# Patient Record
Sex: Male | Born: 1985 | Race: White | Hispanic: No | Marital: Married | Smoking: Never smoker
Health system: Southern US, Community
[De-identification: ages and names within clinical notes are randomized; demographics above are authoritative.]

## PROBLEM LIST (undated history)

## (undated) HISTORY — PX: MOUTH SURGERY: SHX715

---

## 2017-09-08 DIAGNOSIS — H5213 Myopia, bilateral: Secondary | ICD-10-CM | POA: Diagnosis not present

## 2017-11-04 ENCOUNTER — Encounter: Payer: Self-pay | Admitting: Family Medicine

## 2017-11-04 ENCOUNTER — Ambulatory Visit: Payer: Self-pay | Admitting: Family Medicine

## 2017-11-04 VITALS — BP 122/80 | HR 74 | Temp 98.9°F | Ht 68.0 in | Wt 220.0 lb

## 2017-11-04 DIAGNOSIS — Z0000 Encounter for general adult medical examination without abnormal findings: Secondary | ICD-10-CM

## 2017-11-04 LAB — POCT URINALYSIS DIPSTICK
BILIRUBIN UA: NEGATIVE
Blood, UA: NEGATIVE
GLUCOSE UA: NEGATIVE
KETONES UA: NEGATIVE
Leukocytes, UA: NEGATIVE
Nitrite, UA: NEGATIVE
PH UA: 7 (ref 5.0–8.0)
Protein, UA: NEGATIVE
Spec Grav, UA: 1.01 (ref 1.010–1.025)
Urobilinogen, UA: 0.2 E.U./dL

## 2017-11-04 NOTE — Patient Instructions (Signed)
Encouraged efforts to reduce weight include engaging in physical activity as tolerated with goal of 150 minutes per week. Improve dietary choices and eat a meal regimen consistent with a Mediterranean or DASH diet. Reduce simple carbohydrates. Do not skip meals and eat healthy snacks throughout the day to avoid over-eating at dinner. Set a goal weight loss that is achievable for you.  Follow-up with PCP to obtain routine screening labs within the next 12 months.     Health Maintenance, Male A healthy lifestyle and preventive care is important for your health and wellness. Ask your health care provider about what schedule of regular examinations is right for you. What should I know about weight and diet? Eat a Healthy Diet  Eat plenty of vegetables, fruits, whole grains, low-fat dairy products, and lean protein.  Do not eat a lot of foods high in solid fats, added sugars, or salt.  Maintain a Healthy Weight Regular exercise can help you achieve or maintain a healthy weight. You should:  Do at least 150 minutes of exercise each week. The exercise should increase your heart rate and make you sweat (moderate-intensity exercise).  Do strength-training exercises at least twice a week.  Watch Your Levels of Cholesterol and Blood Lipids  Have your blood tested for lipids and cholesterol every 5 years starting at 33 years of age. If you are at high risk for heart disease, you should start having your blood tested when you are 33 years old. You may need to have your cholesterol levels checked more often if: ? Your lipid or cholesterol levels are high. ? You are older than 33 years of age. ? You are at high risk for heart disease.  What should I know about cancer screening? Many types of cancers can be detected early and may often be prevented. Lung Cancer  You should be screened every year for lung cancer if: ? You are a current smoker who has smoked for at least 30 years. ? You are a  former smoker who has quit within the past 15 years.  Talk to your health care provider about your screening options, when you should start screening, and how often you should be screened.  Colorectal Cancer  Routine colorectal cancer screening usually begins at 33 years of age and should be repeated every 5-10 years until you are 33 years old. You may need to be screened more often if early forms of precancerous polyps or small growths are found. Your health care provider may recommend screening at an earlier age if you have risk factors for colon cancer.  Your health care provider may recommend using home test kits to check for hidden blood in the stool.  A small camera at the end of a tube can be used to examine your colon (sigmoidoscopy or colonoscopy). This checks for the earliest forms of colorectal cancer.  Prostate and Testicular Cancer  Depending on your age and overall health, your health care provider may do certain tests to screen for prostate and testicular cancer.  Talk to your health care provider about any symptoms or concerns you have about testicular or prostate cancer.  Skin Cancer  Check your skin from head to toe regularly.  Tell your health care provider about any new moles or changes in moles, especially if: ? There is a change in a mole's size, shape, or color. ? You have a mole that is larger than a pencil eraser.  Always use sunscreen. Apply sunscreen liberally and repeat throughout  the day.  Protect yourself by wearing long sleeves, pants, a wide-brimmed hat, and sunglasses when outside.  What should I know about heart disease, diabetes, and high blood pressure?  If you are 38-39 years of age, have your blood pressure checked every 3-5 years. If you are 39 years of age or older, have your blood pressure checked every year. You should have your blood pressure measured twice-once when you are at a hospital or clinic, and once when you are not at a hospital or  clinic. Record the average of the two measurements. To check your blood pressure when you are not at a hospital or clinic, you can use: ? An automated blood pressure machine at a pharmacy. ? A home blood pressure monitor.  Talk to your health care provider about your target blood pressure.  If you are between 2-52 years old, ask your health care provider if you should take aspirin to prevent heart disease.  Have regular diabetes screenings by checking your fasting blood sugar level. ? If you are at a normal weight and have a low risk for diabetes, have this test once every three years after the age of 45. ? If you are overweight and have a high risk for diabetes, consider being tested at a younger age or more often.  A one-time screening for abdominal aortic aneurysm (AAA) by ultrasound is recommended for men aged 22-75 years who are current or former smokers. What should I know about preventing infection? Hepatitis B If you have a higher risk for hepatitis B, you should be screened for this virus. Talk with your health care provider to find out if you are at risk for hepatitis B infection. Hepatitis C Blood testing is recommended for:  Everyone born from [birth year] through 1966.  Anyone with known risk factors for hepatitis C.  Sexually Transmitted Diseases (STDs)  You should be screened each year for STDs including gonorrhea and chlamydia if: ? You are sexually active and are younger than 33 years of age. ? You are older than 33 years of age and your health care provider tells you that you are at risk for this type of infection. ? Your sexual activity has changed since you were last screened and you are at an increased risk for chlamydia or gonorrhea. Ask your health care provider if you are at risk.  Talk with your health care provider about whether you are at high risk of being infected with HIV. Your health care provider may recommend a prescription medicine to help prevent HIV  infection.  What else can I do?  Schedule regular health, dental, and eye exams.  Stay current with your vaccines (immunizations).  Do not use any tobacco products, such as cigarettes, chewing tobacco, and e-cigarettes. If you need help quitting, ask your health care provider.  Limit alcohol intake to no more than 2 drinks per day. One drink equals 12 ounces of beer, 5 ounces of wine, or 1 ounces of hard liquor.  Do not use street drugs.  Do not share needles.  Ask your health care provider for help if you need support or information about quitting drugs.  Tell your health care provider if you often feel depressed.  Tell your health care provider if you have ever been abused or do not feel safe at home. This information is not intended to replace advice given to you by your health care provider. Make sure you discuss any questions you have with your health care provider. Document  Released: 10/25/2007 Document Revised: 12/26/2015 Document Reviewed: 01/30/2015 Elsevier Interactive Patient Education  [employer].

## 2017-11-04 NOTE — Progress Notes (Signed)
Patient ID: BIRD SWETZ, male    DOB: [birth year], 32 y.o.   MRN: [id number]  PCP: No primary care provider on file.  Chief Complaint  Patient presents with  . Wellness Exam    Subjective:  HPI ZADRIAN MCCAULEY is a 32 y.o. male presents for wellness visit in order to satisfy employee sponsored health insurance benefits. No recent follow-up with a primary care provider. Denies any health concerns. Reports no routine exercise or eating restriction. He is negative of any past surgical procedures. Denies shortness of breath, chest pain, dizziness, or headaches.  Family history: Denies any family history significant for cardiovascular disease, diabetes, and or lung disease.  Social History   Socioeconomic History  . Marital status: Not on file    Spouse name: Not on file  . Number of children: Not on file  . Years of education: Not on file  . Highest education level: Not on file  Occupational History  . Not on file  Social Needs  . Financial resource strain: Not on file  . Food insecurity:    Worry: Not on file    Inability: Not on file  . Transportation needs:    Medical: Not on file    Non-medical: Not on file  Tobacco Use  . Smoking status: Never Smoker  . Smokeless tobacco: Never Used  Substance and Sexual Activity  . Alcohol use: Not on file  . Drug use: Not on file  . Sexual activity: Not on file  Lifestyle  . Physical activity:    Days per week: Not on file    Minutes per session: Not on file  . Stress: Not on file  Relationships  . Social connections:    Talks on phone: Not on file    Gets together: Not on file    Attends religious service: Not on file    Active member of club or organization: Not on file    Attends meetings of clubs or organizations: Not on file    Relationship status: Not on file  . Intimate partner violence:    Fear of current or ex partner: Not on file    Emotionally abused: Not on file    Physically abused: Not on file    Forced  sexual activity: Not on file  Other Topics Concern  . Not on file  Social History Narrative  . Not on file   Review of Systems  Constitutional: Negative.   HENT: Negative.   Eyes: Negative.   Respiratory: Negative.   Cardiovascular: Negative.   Endocrine: Negative.   Genitourinary: Negative.   Musculoskeletal: Negative.   Skin: Negative.   Allergic/Immunologic: Negative.   Neurological: Negative.   Hematological: Negative.   Psychiatric/Behavioral: Negative.    There are no active problems to display for this patient.   Allergies  Allergen Reactions  . Cephalosporins     Prior to Admission medications   Not on File    Past Medical, Surgical Family and Social History reviewed and updated.    Objective:   Today's Vitals   11/04/17 1325  BP: 122/80  Pulse: 74  Temp: 98.9 F (37.2 C)  SpO2: 98%  Weight: 220 lb (99.8 kg)  Height: 5\' 8"  (1.727 m)    Wt Readings from Last 3 Encounters:  11/04/17 220 lb (99.8 kg)   Physical Exam Constitutional: Patient appears well-developed and well-nourished. No distress. HENT: Normocephalic, atraumatic, External right and left ear normal. Oropharynx is clear and moist.  Eyes: Conjunctivae and  EOM are normal. PERRLA, no scleral icterus. Neck: Normal ROM. Neck supple. No JVD. No tracheal deviation. No thyromegaly. CVS: RRR, S1/S2 +, no murmurs, no gallops, no carotid bruit.  Pulmonary: Effort and breath sounds normal, no stridor, rhonchi, wheezes, rales.  Abdominal: Soft. BS +, no distension, tenderness, rebound or guarding.  Musculoskeletal: Normal range of motion. No edema and no tenderness.  Neuro: Alert. Normal reflexes, muscle tone coordination. No cranial nerve deficit. Skin: Skin is warm and dry. No rash noted. Not diaphoretic. No erythema. No pallor. Psychiatric: Normal mood and affect. Behavior, judgment, thought content normal.   Assessment & Plan:  1. Wellness examination Age-appropriate anticipatory guidance  provided  Encouraged efforts to reduce weight include engaging in physical activity as tolerated with goal of 150 minutes per week. Improve dietary choices and eat a meal regimen consistent with a Mediterranean or DASH diet. Reduce simple carbohydrates. Do not skip meals and eat healthy snacks throughout the day to avoid over-eating at dinner.  Set a goal weight loss that is achievable for you. Follow-up with PCP to obtain routine screening labs.  POCT Urinalysis Dipstick, negative    If symptoms worsen or do not improve, return for follow-up, follow-up with PCP, or at the emergency department if severity of symptoms warrant a higher level of care.     Godfrey PickKimberly S. Tiburcio PeaHarris, MSN, FNP-C [facility]  [address]  [place], [place] [id number] [phone]

## 2018-05-12 DIAGNOSIS — U071 COVID-19: Secondary | ICD-10-CM

## 2018-05-12 HISTORY — DX: COVID-19: U07.1

## 2021-03-28 ENCOUNTER — Other Ambulatory Visit: Payer: Self-pay | Admitting: Orthopaedic Surgery

## 2021-03-28 DIAGNOSIS — S82852A Displaced trimalleolar fracture of left lower leg, initial encounter for closed fracture: Secondary | ICD-10-CM

## 2021-03-29 ENCOUNTER — Ambulatory Visit
Admission: RE | Admit: 2021-03-29 | Discharge: 2021-03-29 | Disposition: A | Discharge status: Home / Self Care | Payer: Commercial Managed Care - PPO | Source: Ambulatory Visit | Attending: Orthopaedic Surgery | Admitting: Orthopaedic Surgery

## 2021-03-29 DIAGNOSIS — S82852A Displaced trimalleolar fracture of left lower leg, initial encounter for closed fracture: Secondary | ICD-10-CM

## 2021-04-01 ENCOUNTER — Other Ambulatory Visit: Payer: Self-pay

## 2021-04-01 ENCOUNTER — Encounter (HOSPITAL_COMMUNITY): Payer: Self-pay | Admitting: Orthopaedic Surgery

## 2021-04-01 NOTE — Progress Notes (Signed)
Spoke with pt for pre-op call. Pt denies cardiac history, HTN or Diabetes. Pt is on an 81 mg Aspirin twice day to prevent blood clots (started on it by his orthopedist)  Pt's surgery is scheduled as ambulatory so no Covid test is required prior to surgery.

## 2021-04-02 NOTE — Anesthesia Preprocedure Evaluation (Addendum)
Anesthesia Evaluation  Patient identified by MRN, date of birth, ID band Patient awake    Reviewed: Allergy & Precautions, NPO status , Patient's Chart, lab work & pertinent test results  Airway Mallampati: II  TM Distance: >3 FB Neck ROM: Full    Dental no notable dental hx. (+) Teeth Intact, Dental Advisory Given   Pulmonary neg pulmonary ROS,    Pulmonary exam normal breath sounds clear to auscultation       Cardiovascular negative cardio ROS Normal cardiovascular exam Rhythm:Regular Rate:Normal     Neuro/Psych negative neurological ROS  negative psych ROS   GI/Hepatic negative GI ROS, Neg liver ROS,   Endo/Other  negative endocrine ROS  Renal/GU negative Renal ROS  negative genitourinary   Musculoskeletal negative musculoskeletal ROS (+)   Abdominal   Peds  Hematology negative hematology ROS (+)   Anesthesia Other Findings   Reproductive/Obstetrics                           Anesthesia Physical Anesthesia Plan  ASA: 1  Anesthesia Plan: General and Regional   Post-op Pain Management: Regional block and Tylenol PO (pre-op)   Induction: Intravenous  PONV Risk Score and Plan: 2 and Ondansetron, Dexamethasone and Midazolam  Airway Management Planned: LMA  Additional Equipment:   Intra-op Plan:   Post-operative Plan: Extubation in OR  Informed Consent: I have reviewed the patients History and Physical, chart, labs and discussed the procedure including the risks, benefits and alternatives for the proposed anesthesia with the patient or authorized representative who has indicated his/her understanding and acceptance.     Dental advisory given  Plan Discussed with: CRNA  Anesthesia Plan Comments:         Anesthesia Quick Evaluation

## 2021-04-02 NOTE — Discharge Instructions (Addendum)
Deepak Ramanathan, MD °[facility] ° °Please read the following information regarding your care after surgery. ° °Medications  °You only need a prescription for the narcotic pain medicine (ex. oxycodone, Percocet, Norco).  All of the other medicines listed below are available over the counter. °? Aleve 2 pills twice a day for the first 3 days after surgery. °? acetominophen (Tylenol) 650 mg every 4-6 hours as you need for minor to moderate pain °? oxycodone as prescribed for severe pain ° °? To help prevent blood clots, take aspirin (325 mg) twice daily for 30 days after surgery.  You should also get up every hour while you are awake to move around.   ° °Weight Bearing °? Do NOT bear any weight on the operated leg or foot. ° °Cast / Splint / Dressing °? If you have a splint, do NOT remove this. Keep your splint, cast or dressing clean and dry.  Don’t put anything (coat hanger, pencil, etc) down inside of it.  If it gets wet, call the office immediately to schedule an appointment for a cast change. ° °Swelling °IMPORTANT: It is normal for you to have swelling where you had surgery. To reduce swelling and pain, keep at least 3 pillows under your leg so that your toes are above your nose and your heel is above the level of your hip.  It may be necessary to keep your foot or leg elevated for several weeks.  This is critical to helping your incisions heal and your pain to feel better. ° °Follow Up °Call my office at [phone] when you are discharged from the hospital or surgery center to schedule an appointment to be seen 2-3 weeks after surgery. ° °Call my office at [phone] if you develop a fever >101.5° F, nausea, vomiting, bleeding from the surgical site or severe pain.   ° ° °

## 2021-04-03 ENCOUNTER — Ambulatory Visit (HOSPITAL_COMMUNITY): Payer: Commercial Managed Care - PPO | Admitting: Anesthesiology

## 2021-04-03 ENCOUNTER — Encounter (HOSPITAL_COMMUNITY)
Admission: RE | Disposition: A | Discharge status: Home / Self Care | Payer: Self-pay | Source: Home / Self Care | Attending: Orthopaedic Surgery

## 2021-04-03 ENCOUNTER — Ambulatory Visit (HOSPITAL_COMMUNITY)
Admission: RE | Admit: 2021-04-03 | Discharge: 2021-04-03 | Disposition: A | Discharge status: Home / Self Care | Payer: Commercial Managed Care - PPO | Attending: Orthopaedic Surgery | Admitting: Orthopaedic Surgery

## 2021-04-03 ENCOUNTER — Ambulatory Visit (HOSPITAL_COMMUNITY): Payer: Commercial Managed Care - PPO

## 2021-04-03 DIAGNOSIS — W1789XA Other fall from one level to another, initial encounter: Secondary | ICD-10-CM | POA: Diagnosis not present

## 2021-04-03 DIAGNOSIS — S82872A Displaced pilon fracture of left tibia, initial encounter for closed fracture: Secondary | ICD-10-CM | POA: Insufficient documentation

## 2021-04-03 DIAGNOSIS — S82452A Displaced comminuted fracture of shaft of left fibula, initial encounter for closed fracture: Secondary | ICD-10-CM | POA: Insufficient documentation

## 2021-04-03 DIAGNOSIS — Z419 Encounter for procedure for purposes other than remedying health state, unspecified: Secondary | ICD-10-CM

## 2021-04-03 HISTORY — PX: ORIF ANKLE FRACTURE: SHX5408

## 2021-04-03 LAB — CBC
HCT: 44.4 % (ref 39.0–52.0)
Hemoglobin: 15.6 g/dL (ref 13.0–17.0)
MCH: 29.3 pg (ref 26.0–34.0)
MCHC: 35.1 g/dL (ref 30.0–36.0)
MCV: 83.5 fL (ref 80.0–100.0)
Platelets: 321 10*3/uL (ref 150–400)
RBC: 5.32 MIL/uL (ref 4.22–5.81)
RDW: 12 % (ref 11.5–15.5)
WBC: 7.2 10*3/uL (ref 4.0–10.5)
nRBC: 0 % (ref 0.0–0.2)

## 2021-04-03 LAB — SURGICAL PCR SCREEN
MRSA, PCR: NEGATIVE
Staphylococcus aureus: NEGATIVE

## 2021-04-03 SURGERY — OPEN REDUCTION INTERNAL FIXATION (ORIF) ANKLE FRACTURE
Anesthesia: Regional | Site: Ankle | Laterality: Left

## 2021-04-03 MED ORDER — ORAL CARE MOUTH RINSE: 15.0000 mL | Freq: Once | OROMUCOSAL | Status: AC

## 2021-04-03 MED ORDER — CHLORHEXIDINE GLUCONATE 0.12 % MT SOLN
15.0000 mL | Freq: Once | OROMUCOSAL | Status: AC
  Administered 2021-04-03: 15 mL via OROMUCOSAL
  Filled 2021-04-03: qty 15

## 2021-04-03 MED ORDER — PHENYLEPHRINE HCL (PRESSORS) 10 MG/ML IV SOLN
INTRAVENOUS | Status: DC | PRN
  Administered 2021-04-03 (×3): 80 ug via INTRAVENOUS
  Administered 2021-04-03: 160 ug via INTRAVENOUS
  Administered 2021-04-03: 120 ug via INTRAVENOUS

## 2021-04-03 MED ORDER — VANCOMYCIN HCL 1000 MG IV SOLR
INTRAVENOUS | Status: AC
  Filled 2021-04-03: qty 20

## 2021-04-03 MED ORDER — ACETAMINOPHEN 500 MG PO TABS
1000.0000 mg | ORAL_TABLET | Freq: Once | ORAL | Status: AC
  Administered 2021-04-03: 1000 mg via ORAL
  Filled 2021-04-03: qty 2

## 2021-04-03 MED ORDER — ROPIVACAINE HCL 5 MG/ML IJ SOLN
INTRAMUSCULAR | Status: DC | PRN
  Administered 2021-04-03: 20 mL via PERINEURAL
  Administered 2021-04-03: 30 mL via PERINEURAL

## 2021-04-03 MED ORDER — ONDANSETRON HCL 4 MG/2ML IJ SOLN
INTRAMUSCULAR | Status: DC | PRN
  Administered 2021-04-03: 4 mg via INTRAVENOUS

## 2021-04-03 MED ORDER — PROPOFOL 10 MG/ML IV BOLUS
INTRAVENOUS | Status: DC | PRN
  Administered 2021-04-03: 200 mg via INTRAVENOUS

## 2021-04-03 MED ORDER — DEXAMETHASONE SODIUM PHOSPHATE 4 MG/ML IJ SOLN
INTRAMUSCULAR | Status: DC | PRN
  Administered 2021-04-03: 8 mg via INTRAVENOUS

## 2021-04-03 MED ORDER — SODIUM CHLORIDE 0.9 % IR SOLN
Status: DC | PRN
  Administered 2021-04-03: 1000 mL

## 2021-04-03 MED ORDER — VANCOMYCIN HCL 1000 MG IV SOLR
INTRAVENOUS | Status: DC | PRN
  Administered 2021-04-03: 1000 mg via TOPICAL

## 2021-04-03 MED ORDER — LACTATED RINGERS IV SOLN: INTRAVENOUS | Status: DC

## 2021-04-03 MED ORDER — CIPROFLOXACIN IN D5W 400 MG/200ML IV SOLN
400.0000 mg | Freq: Once | INTRAVENOUS | Status: AC
  Administered 2021-04-03: 400 mg via INTRAVENOUS
  Filled 2021-04-03: qty 200

## 2021-04-03 MED ORDER — FENTANYL CITRATE (PF) 250 MCG/5ML IJ SOLN
INTRAMUSCULAR | Status: AC
  Filled 2021-04-03: qty 5

## 2021-04-03 MED ORDER — FENTANYL CITRATE (PF) 100 MCG/2ML IJ SOLN: 25.0000 ug | INTRAMUSCULAR | Status: DC | PRN

## 2021-04-03 MED ORDER — VANCOMYCIN HCL IN DEXTROSE 1-5 GM/200ML-% IV SOLN
1000.0000 mg | INTRAVENOUS | Status: AC
  Administered 2021-04-03: 1000 mg via INTRAVENOUS
  Filled 2021-04-03: qty 200

## 2021-04-03 MED ORDER — MIDAZOLAM HCL 2 MG/2ML IJ SOLN
INTRAMUSCULAR | Status: AC
  Administered 2021-04-03: 2 mg
  Filled 2021-04-03: qty 2

## 2021-04-03 MED ORDER — DEXAMETHASONE SODIUM PHOSPHATE 10 MG/ML IJ SOLN
INTRAMUSCULAR | Status: DC | PRN
  Administered 2021-04-03 (×2): 5 mg

## 2021-04-03 MED ORDER — FENTANYL CITRATE (PF) 100 MCG/2ML IJ SOLN
INTRAMUSCULAR | Status: DC | PRN
  Administered 2021-04-03: 50 ug via INTRAVENOUS
  Administered 2021-04-03: 75 ug via INTRAVENOUS
  Administered 2021-04-03: 50 ug via INTRAVENOUS
  Administered 2021-04-03: 75 ug via INTRAVENOUS

## 2021-04-03 MED ORDER — LACTATED RINGERS IV SOLN: INTRAVENOUS | Status: DC | PRN

## 2021-04-03 MED ORDER — FENTANYL CITRATE (PF) 100 MCG/2ML IJ SOLN
INTRAMUSCULAR | Status: AC
  Administered 2021-04-03: 100 ug
  Filled 2021-04-03: qty 2

## 2021-04-03 MED ORDER — MUPIROCIN 2 % EX OINT: 1.0000 "application " | TOPICAL_OINTMENT | Freq: Two times a day (BID) | CUTANEOUS | Status: DC

## 2021-04-03 MED ORDER — MIDAZOLAM HCL 2 MG/2ML IJ SOLN
INTRAMUSCULAR | Status: AC
  Filled 2021-04-03: qty 2

## 2021-04-03 MED ORDER — LIDOCAINE 2% (20 MG/ML) 5 ML SYRINGE
INTRAMUSCULAR | Status: DC | PRN
  Administered 2021-04-03: 40 mg via INTRAVENOUS

## 2021-04-03 SURGICAL SUPPLY — 86 items
ANCHOR SUT KEITH ABD SZ2 STR (SUTURE) IMPLANT
BAG COUNTER SPONGE SURGICOUNT (BAG) IMPLANT
BIT DRILL 2.5X2.75 QC CALB (BIT) ×2 IMPLANT
BIT DRILL 2.9 CANN QC NONSTRL (BIT) ×2 IMPLANT
BIT DRILL 3.5X5.5 QC CALB (BIT) ×2 IMPLANT
BIT DRILL CALIBRATED 2.7 (BIT) IMPLANT
BLADE 15 SAFETY STRL DISP (BLADE) IMPLANT
BLADE LONG MED 31X9 (MISCELLANEOUS) ×2 IMPLANT
BNDG COHESIVE 4X5 TAN ST LF (GAUZE/BANDAGES/DRESSINGS) IMPLANT
BNDG COHESIVE 6X5 TAN NS LF (GAUZE/BANDAGES/DRESSINGS) IMPLANT
BNDG COHESIVE 6X5 TAN ST LF (GAUZE/BANDAGES/DRESSINGS) IMPLANT
BNDG ELASTIC 4X5.8 VLCR STR LF (GAUZE/BANDAGES/DRESSINGS) ×2 IMPLANT
BNDG ELASTIC 6X5.8 VLCR STR LF (GAUZE/BANDAGES/DRESSINGS) ×2 IMPLANT
BNDG GAUZE ELAST 4 BULKY (GAUZE/BANDAGES/DRESSINGS) ×2 IMPLANT
CHLORAPREP W/TINT 26 (MISCELLANEOUS) ×2 IMPLANT
COVER SURGICAL LIGHT HANDLE (MISCELLANEOUS) ×2 IMPLANT
CUFF TOURN SGL QUICK 34 (TOURNIQUET CUFF) ×1
CUFF TRNQT CYL 34X4.125X (TOURNIQUET CUFF) ×1 IMPLANT
DECANTER SPIKE VIAL GLASS SM (MISCELLANEOUS) IMPLANT
DRAPE C-ARM 42X120 X-RAY (DRAPES) ×2 IMPLANT
DRAPE C-ARM MINI 42X72 WSTRAPS (DRAPES) IMPLANT
DRAPE C-ARMOR (DRAPES) ×2 IMPLANT
DRAPE EXTREMITY T 121X128X90 (DISPOSABLE) IMPLANT
DRAPE OEC MINIVIEW 54X84 (DRAPES) IMPLANT
DRAPE ORTHO SPLIT 77X108 STRL (DRAPES)
DRAPE SURG ORHT 6 SPLT 77X108 (DRAPES) IMPLANT
DRAPE U-SHAPE 47X51 STRL (DRAPES) ×2 IMPLANT
DRSG ADAPTIC 3X8 NADH LF (GAUZE/BANDAGES/DRESSINGS) IMPLANT
DRSG PAD ABDOMINAL 8X10 ST (GAUZE/BANDAGES/DRESSINGS) ×2 IMPLANT
DRSG XEROFORM 1X8 (GAUZE/BANDAGES/DRESSINGS) ×4 IMPLANT
ELECT REM PT RETURN 15FT ADLT (MISCELLANEOUS) ×2 IMPLANT
FACESHIELD WRAPAROUND (MASK) IMPLANT
GAUZE SPONGE 4X4 12PLY STRL (GAUZE/BANDAGES/DRESSINGS) ×4 IMPLANT
GAUZE XEROFORM 5X9 LF (GAUZE/BANDAGES/DRESSINGS) IMPLANT
GLOVE SRG 8 PF TXTR STRL LF DI (GLOVE) ×2 IMPLANT
GLOVE SURG ENC MOIS LTX SZ7.5 (GLOVE) ×2 IMPLANT
GLOVE SURG MICRO LTX SZ7.5 (GLOVE) ×4 IMPLANT
GLOVE SURG UNDER POLY LF SZ7.5 (GLOVE) ×2 IMPLANT
GLOVE SURG UNDER POLY LF SZ8 (GLOVE) ×2
K-WIRE ACE 1.6X6 (WIRE) ×4
KIT BASIN OR (CUSTOM PROCEDURE TRAY) ×2 IMPLANT
KIT TURNOVER KIT A (KITS) IMPLANT
KWIRE ACE 1.6X6 (WIRE) ×2 IMPLANT
NEEDLE HYPO 22GX1.5 SAFETY (NEEDLE) IMPLANT
NEEDLE MAYO CATGUT SZ4 (NEEDLE) IMPLANT
NS IRRIG 1000ML POUR BTL (IV SOLUTION) ×2 IMPLANT
PACK ORTHO EXTREMITY (CUSTOM PROCEDURE TRAY) ×2 IMPLANT
PAD CAST 4YDX4 CTTN HI CHSV (CAST SUPPLIES) ×5 IMPLANT
PADDING CAST COTTON 4X4 STRL (CAST SUPPLIES) ×5
PADDING CAST COTTON 6X4 STRL (CAST SUPPLIES) ×2 IMPLANT
PLATE ACE 3.5MM 4HOLE (Plate) ×2 IMPLANT
PLATE LOCK 7H 92 BILAT FIB (Plate) ×2 IMPLANT
PROTECTOR NERVE ULNAR (MISCELLANEOUS) IMPLANT
SCREW CANC LAG 4X40 (Screw) ×2 IMPLANT
SCREW CORT LP 3.5X12 (Screw) ×2 IMPLANT
SCREW CORT LP 3.5X14 (Screw) ×2 IMPLANT
SCREW CORT LP T15 3.5X16 (Screw) ×2 IMPLANT
SCREW LOCK ALPS 3.5X12 (Screw) ×2 IMPLANT
SCREW LOCK CORT STAR 3.5X12 (Screw) ×2 IMPLANT
SCREW NLOCK CANC HEX 4X24 (Screw) ×1 IMPLANT
SCREW NLOCK CANC HEX 4X28 (Screw) ×1 IMPLANT
SCREW NLOCK CANC HEX 4X32 (Screw) ×2 IMPLANT
SCREW NLOCK FT 28X4XSLD NS FIB (Screw) ×1 IMPLANT
SCREW NLOCK T15 FT 24X4XST TPR (Screw) ×1 IMPLANT
SPLINT PLASTER CAST XFAST 5X30 (CAST SUPPLIES) ×1 IMPLANT
SPLINT PLASTER XFAST SET 5X30 (CAST SUPPLIES) ×1
SPONGE T-LAP 18X18 ~~LOC~~+RFID (SPONGE) ×4 IMPLANT
STAPLER VISISTAT 35W (STAPLE) IMPLANT
STOCKINETTE TUBULAR 6 INCH (GAUZE/BANDAGES/DRESSINGS) IMPLANT
STOCKINETTE TUBULAR COTT 4X25 (GAUZE/BANDAGES/DRESSINGS) IMPLANT
STRIP CLOSURE SKIN 1/2X4 (GAUZE/BANDAGES/DRESSINGS) IMPLANT
SUCTION FRAZIER HANDLE 10FR (MISCELLANEOUS) ×1
SUCTION TUBE FRAZIER 10FR DISP (MISCELLANEOUS) ×1 IMPLANT
SUT ETHILON 3 0 PS 1 (SUTURE) ×6 IMPLANT
SUT MON AB 3-0 SH 27 (SUTURE) ×2
SUT MON AB 3-0 SH27 (SUTURE) ×2 IMPLANT
SUT VIC AB 2-0 CT1 27 (SUTURE) ×1
SUT VIC AB 2-0 CT1 TAPERPNT 27 (SUTURE) ×1 IMPLANT
SUT VIC AB 2-0 SH 27 (SUTURE) ×2
SUT VIC AB 2-0 SH 27XBRD (SUTURE) ×2 IMPLANT
SUT VIC AB 3-0 PS2 18 (SUTURE) ×1
SUT VIC AB 3-0 PS2 18XBRD (SUTURE) ×1 IMPLANT
SUT VIC AB 3-0 SH 27 (SUTURE) ×2
SUT VIC AB 3-0 SH 27X BRD (SUTURE) ×2 IMPLANT
SYR CONTROL 10ML LL (SYRINGE) ×2 IMPLANT
WATER STERILE IRR 1000ML POUR (IV SOLUTION) IMPLANT

## 2021-04-03 NOTE — Anesthesia Procedure Notes (Signed)
Procedure Name: LMA Insertion Date/Time: 04/03/2021 8:15 AM Performed by: Darryl Nestle, CRNA Pre-anesthesia Checklist: Patient identified, Emergency Drugs available, Suction available and Patient being monitored Patient Re-evaluated:Patient Re-evaluated prior to induction Oxygen Delivery Method: Circle system utilized Preoxygenation: Pre-oxygenation with 100% oxygen Induction Type: IV induction Ventilation: Mask ventilation without difficulty LMA: LMA inserted LMA Size: 4.0 Tube type: Oral Number of attempts: 1 Placement Confirmation: positive ETCO2 and breath sounds checked- equal and bilateral Tube secured with: Tape Dental Injury: Teeth and Oropharynx as per pre-operative assessment

## 2021-04-03 NOTE — H&P (Signed)
ORTHOPAEDIC SURGERY H&P  Subjective:  The patient presents with left pilon and fibula fractures. He sustained this after falling off wall during a business trip to [place]. He ambulated on this left ankle in regular shoes for a couple days and caught flights thru [place] to come back home to [place]. Due to persistent pain and worsening swelling, he was evaluated and found to have left pilon and fibula fractures. He was agreeable to surgery and immobilized in splint until then. He was placed on VTE ppx in interim.   Past Medical History:  Diagnosis Date   COVID 2020   and March 2022    Past Surgical History:  Procedure Laterality Date   MOUTH SURGERY       (Not in an outpatient encounter)    Allergies  Allergen Reactions   Cephalosporins Hives    Social History   Socioeconomic History   Marital status: Married    Spouse name: Not on file   Number of children: Not on file   Years of education: Not on file   Highest education level: Not on file  Occupational History   Not on file  Tobacco Use   Smoking status: Never   Smokeless tobacco: Never  Vaping Use   Vaping Use: Never used  Substance and Sexual Activity   Alcohol use: Yes    Comment: occasional   Drug use: Never   Sexual activity: Not on file  Other Topics Concern   Not on file  Social History Narrative   Not on file   Social Determinants of Health   Financial Resource Strain: Not on file  Food Insecurity: Not on file  Transportation Needs: Not on file  Physical Activity: Not on file  Stress: Not on file  Social Connections: Not on file  Intimate Partner Violence: Not on file     History reviewed. No pertinent family history.   Review of Systems Pertinent items are noted in HPI.  Objective: Vital signs in last 24 hours: Vitals with BMI 04/03/2021 04/03/2021 04/03/2021  Height - - 5\' 8"   Weight - - 230 lbs  BMI - - 34.98  Systolic 131 128  Diastolic 111 80 68  Pulse 74 75 93       EXAM: General: Well nourished, well developed. Awake, alert and oriented to time, place, person. Normal mood and affect. No apparent distress. Breathing room air.  Operative Lower Extremity: Alignment - Neutral Deformity - None Skin intact Tenderness to palpation - Left ankle Wiggles toes Sensation intact to light touch throughout Palpable DP and PT pulses Special testing: None The contralateral foot/ankle was examined for comparison and noted to be neurovascularly intact with no localized deformity, swelling, or tenderness.  Imaging Review X-rays of the left ankle and CT taken were independently reviewed by me demonstrating pilon variant fracture (posteromedial based) with comminuted displaced fibula fracture.  Assessment/Plan: The clinical and radiographic findings were reviewed and discussed at length with the patient.  The patient has left pilon and fibula fractures.  We spoke at length about the natural course of these findings. We discussed nonoperative and operative treatment options in detail.  I have recommended the following: Surgery in the form of ORIF left ankle with possible external fixator placement. The risks and benefits were presented and reviewed. The risks due to hardware failure/irritation, infection, stiffness, nerve/vessel/tendon injury, nonunion/malunion, wound healing issues, failure of this surgery, need for further surgery, thromboembolic events, amputation, death among others were discussed. The patient acknowledged the  explanation, agreed to proceed with the plan and a consent was signed.  Netta Cedars  Orthopaedic Surgery [facility]

## 2021-04-03 NOTE — Anesthesia Procedure Notes (Addendum)
Anesthesia Regional Block: Adductor canal block   Pre-Anesthetic Checklist: , timeout performed,  Correct Patient, Correct Site, Correct Laterality,  Correct Procedure, Correct Position, site marked,  Risks and benefits discussed,  Surgical consent,  Pre-op evaluation,  At surgeon's request and post-op pain management  Laterality: Left  Prep: Maximum Sterile Barrier Precautions used, chloraprep       Needles:  Injection technique: Single-shot  Needle Type: Echogenic Stimulator Needle     Needle Length: 9cm  Needle Gauge: 22     Additional Needles:   Procedures:,,,, ultrasound used (permanent image in chart),,    Narrative:  Start time: 04/03/2021 7:47 AM End time: 04/03/2021 7:51 AM Injection made incrementally with aspirations every 5 mL.  Performed by: Personally  Anesthesiologist: Elmer Picker, MD  Additional Notes: Monitors applied. No increased pain on injection. No increased resistance to injection. Injection made in 5cc increments. Good needle visualization. Patient tolerated procedure well.

## 2021-04-03 NOTE — H&P (Signed)
H&P Update:  -History and Physical Reviewed  -Patient has been re-examined  -No change in the plan of care  -The risks and benefits were presented and reviewed. The risks due to hardware failure/irritation, infection, stiffness, nerve/vessel/tendon injury, nonunion/malunion, wound healing issues, development of arthritis, failure of this surgery, possibility of external fixation with delayed definitive surgery, need for further surgery, thromboembolic events, anesthesia/medical complications, amputation, death among others were discussed. The patient acknowledged the explanation, agreed to proceed with the plan and a consent was signed.  Antonio Rhodes

## 2021-04-03 NOTE — Transfer of Care (Signed)
Immediate Anesthesia Transfer of Care Note  Patient: Antonio Rhodes  Procedure(s) Performed: OPEN REDUCTION INTERNAL FIXATION (ORIF) ANKLE FRACTURE (Left: Ankle)  Patient Location: PACU  Anesthesia Type:GA combined with regional for post-op pain  Level of Consciousness: awake, alert  and oriented  Airway & Oxygen Therapy: Patient Spontanous Breathing  Post-op Assessment: Report given to RN and Post -op Vital signs reviewed and stable  Post vital signs: Reviewed and stable  Last Vitals:  Vitals Value Taken Time  BP 139/44 04/03/21 1114  Temp    Pulse 95 04/03/21 1114  Resp 15 04/03/21 1114  SpO2 98 % 04/03/21 1114  Vitals shown include unvalidated device data.  Last Pain:  Vitals:   04/03/21 0703  TempSrc:   PainSc: 2       Patients Stated Pain Goal: 2 (04/03/21 0703)  Complications: No notable events documented.

## 2021-04-03 NOTE — Anesthesia Postprocedure Evaluation (Signed)
Anesthesia Post Note  Patient: Antonio Rhodes  Procedure(s) Performed: OPEN REDUCTION INTERNAL FIXATION (ORIF) ANKLE FRACTURE (Left: Ankle)     Patient location during evaluation: PACU Anesthesia Type: Regional and General Level of consciousness: awake and alert Pain management: pain level controlled Vital Signs Assessment: post-procedure vital signs reviewed and stable Respiratory status: spontaneous breathing, nonlabored ventilation, respiratory function stable and patient connected to nasal cannula oxygen Cardiovascular status: blood pressure returned to baseline and stable Postop Assessment: no apparent nausea or vomiting Anesthetic complications: no   No notable events documented.  Last Vitals:  Vitals:   04/03/21 1130 04/03/21 1145  BP: 132/77 (!) 111/52  Pulse: 83 74  Resp: 12 12  Temp:  36.9 C  SpO2: 99% 97%    Last Pain:  Vitals:   04/03/21 1145  TempSrc:   PainSc: 0-No pain                 Chandy Tarman L Makela Niehoff

## 2021-04-03 NOTE — Anesthesia Procedure Notes (Addendum)
Anesthesia Regional Block: Popliteal block   Pre-Anesthetic Checklist: , timeout performed,  Correct Patient, Correct Site, Correct Laterality,  Correct Procedure, Correct Position, site marked,  Risks and benefits discussed,  Surgical consent,  Pre-op evaluation,  At surgeon's request and post-op pain management  Laterality: Left  Prep: Maximum Sterile Barrier Precautions used, chloraprep       Needles:  Injection technique: Single-shot  Needle Type: Echogenic Stimulator Needle     Needle Length: 9cm  Needle Gauge: 22     Additional Needles:   Procedures:,,,, ultrasound used (permanent image in chart),,    Narrative:  Start time: 04/03/2021 7:43 AM End time: 04/03/2021 7:47 AM Injection made incrementally with aspirations every 5 mL.  Performed by: Personally  Anesthesiologist: Elmer Picker, MD  Additional Notes: Monitors applied. No increased pain on injection. No increased resistance to injection. Injection made in 5cc increments. Good needle visualization. Patient tolerated procedure well.

## 2021-04-03 NOTE — Op Note (Signed)
04/03/2021  1:34 PM   PATIENT: Antonio Rhodes  35 y.o. male  MRN: [id number]   PRE-OPERATIVE DIAGNOSIS:   Displaced left tibia pilon and fibula fractures left lower leg   POST-OPERATIVE DIAGNOSIS:   Displaced left tibia pilon and fibula fractures left lower leg   PROCEDURE: Left distal tibia pilon fracture open reduction internal fixation Left distal fibula fracture open reduction internal fixation   SURGEON:  Netta Cedars, MD   ASSISTANT: None   ANESTHESIA: General, regional   EBL: Minimal   TOURNIQUET:    Total Tourniquet Time Documented: Thigh (Left) - 86 minutes Total: Thigh (Left) - 86 minutes    COMPLICATIONS: None apparent   DISPOSITION: Extubated, awake and stable to recovery.   INDICATION FOR PROCEDURE: The patient presents with left pilon and fibula fractures. He sustained this after falling off wall during a business trip to [employer]. He ambulated on this left ankle in regular shoes for a couple days and caught flights thru [place] to come back home to [place]. Due to persistent pain and worsening swelling, he was evaluated and found to have left pilon and fibula fractures. He was agreeable to surgery and immobilized in splint until then. He was placed on VTE ppx in interim.  We discussed the diagnosis, alternative treatment options, risks and benefits of the above surgical intervention, as well as alternative non-operative treatments. All questions/concerns were addressed and the patient/family demonstrated appropriate understanding of the diagnosis, the procedure, the postoperative course, and overall prognosis. The patient wished to proceed with surgical intervention and signed an informed surgical consent as such, in each others presence prior to surgery.   PROCEDURE IN DETAIL: After preoperative consent was obtained and the correct operative site was identified, the patient was brought to the operating room supine on stretcher and  transferred onto operating table.  General anesthesia was induced. Preoperative antibiotics were administered. Surgical timeout was taken. The patient was then positioned supine with an ipsilateral hip bump. The operative lower extremity was prepped and draped in standard sterile fashion with a tourniquet around the thigh. The extremity was exsanguinated and the tourniquet was inflated to 275 mmHg.  A standard lateral incision was made over the distal fibula. Dissection was carried down to the level of the fibula and the fracture site identified. The superficial peroneal nerve was identified and protected throughout the procedure. The fibula was noted to be shortened with interposed periosteum. The fracture was debrided and the edges defined to achieve cortical read. Reduction maneuver was performed using pointed reduction forceps and lobster forceps. In this manner, the fibula length was restored and fracture reduced. A 3.5 lag screw was placed using lag by technique and excellent compression was obtained. However, due to very distal and comminuted nature of oblique fracture, we selected a Zimmer composite locking plate and placed it laterally. This was implanted under intraoperative fluoroscopy with a combination of distal locking screws and proximal cortical & locking screws.   We then made a posteromedial approach to the distal tibia to treat the pilon fracture. Dissection was carried down in plane between posterior tibialis tendon and the posterior border of the medial malleolus. The posterior tibialis tendon was maintained within its own sheath and was protected throughout the procedure. The fracture edges were debrided with blade and dental pick. A posteromedial one third tubular plate was implanted in antiglide buttress fashion. Non locking cortical screws were implanted through this plate, beginning with the fully threaded antiglide screw proximal to the apex of  the pilon fracture. The most proximal  plate hole was used for another fully threaded cortical screw. Both of these proximal screws were evaluated fluoroscopically to ensure appropriate length in the anterior tibia. The distal most screw hole was used to apply a partially thread lag screw to capture the distal medial malleolar fragment.  A manual external rotation stress radiograph was obtained and demonstrated stable ankle mortise following ankle fixation.  The surgical sites were thoroughly irrigated. The tourniquet was deflated and hemostasis achieved. The deep layers were closed using 2-0 vicryl and the subcutaneous tissues closed using 3-0 vicryl. The skin was closed without tension using 3-0 nylon suture.    The leg was cleaned with saline and sterile xeroform dressings with gauze were applied. A well padded bulky short leg splint was applied. The patient was awakened from anesthesia and transported to the recovery room in stable condition.    FOLLOW UP PLAN: -transfer to PACU, then home -strict NWB operative extremity, maximum elevation -maintain short leg splint until follow up -DVT Ppx: Aspirin 325 mg twice daily x 30 days -sutures out in 2-3 weeks with exchange of short leg splint to short leg cast in outpatient office   RADIOGRAPHS: AP, lateral, oblique and stress radiographs of the left ankle were obtained intraoperatively. These showed interval reduction and fixation of the fractures. Manual stress radiographs were taken and the ankle mortise and tibiofibular relationship were noted to be stable following fixation. All hardware is appropriately positioned and of the appropriate lengths. No other acute injuries are noted.   Netta Cedars [facility]

## 2021-04-08 ENCOUNTER — Encounter (HOSPITAL_COMMUNITY): Payer: Self-pay | Admitting: Orthopaedic Surgery

## 2021-04-10 ENCOUNTER — Encounter (HOSPITAL_COMMUNITY): Payer: Self-pay | Admitting: Orthopaedic Surgery

## 2022-11-25 IMAGING — CT CT ANKLE*L* W/O CM
3 series · 13 of 33 positions shown, 16 images · non-contrast
Comparison: None.

CLINICAL DATA: Fall

EXAM:
CT OF THE LEFT ANKLE WITHOUT CONTRAST
TECHNIQUE: Multidetector CT imaging of the left ankle was performed according
to the standard protocol. Multiplanar CT image reconstructions were
also generated.

[Series 5: sfov lower extremity 2.00 br40 s3 soft · axial · 0.26mm/px · z∈[+563,+681]mm · 5 of 87 slices shown, 7 images (1 of 3)]
[im 14/87  soft-tissue]
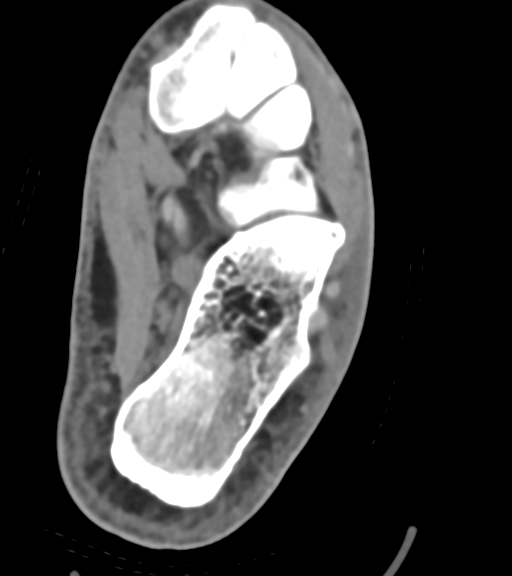
[im 14/87  bone]
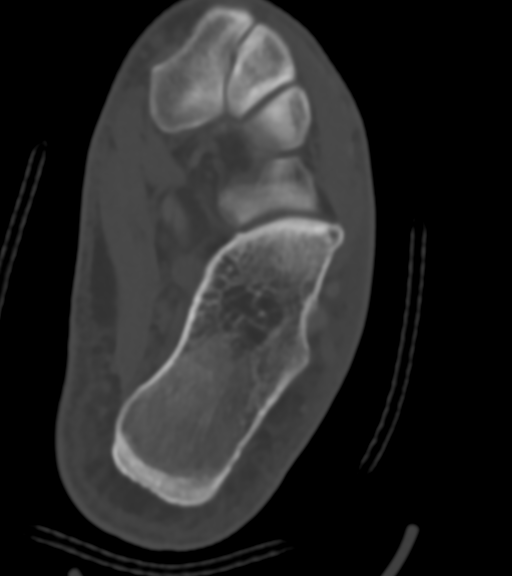
[im 27/87  bone]
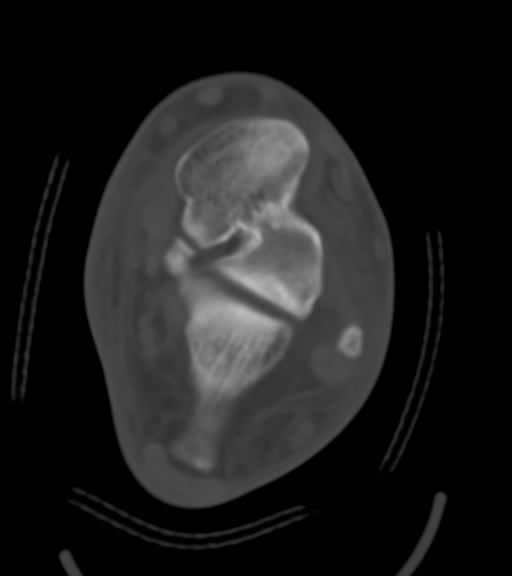
[im 47/87  bone]
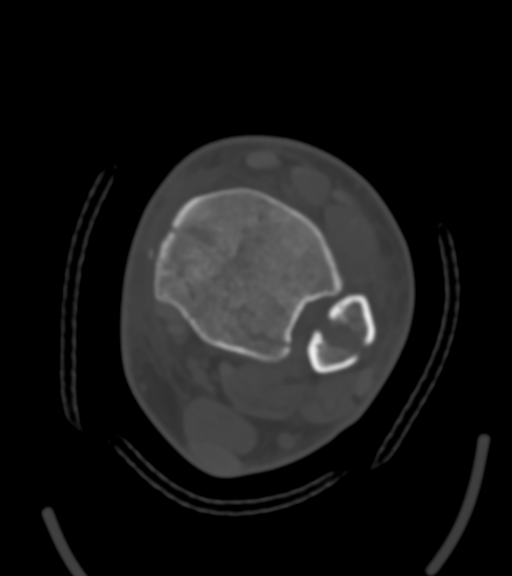
[im 60/87  bone]
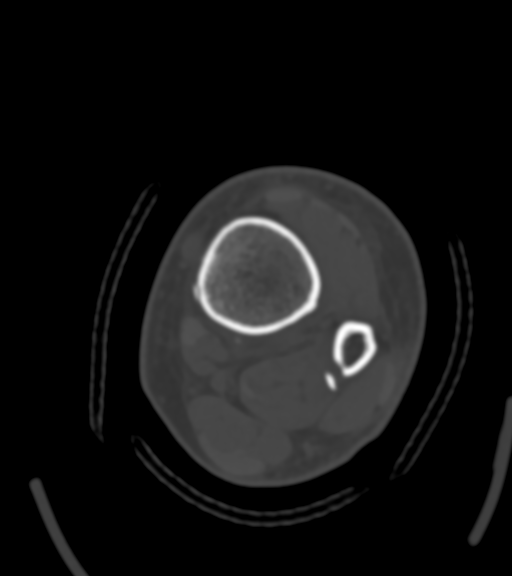
[im 73/87  soft-tissue]
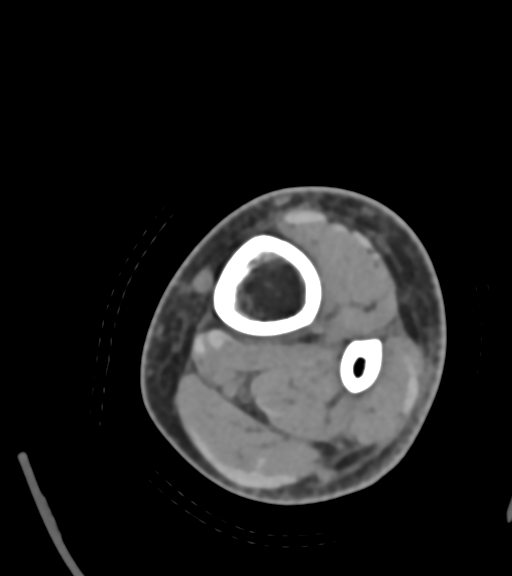
[im 73/87  bone]
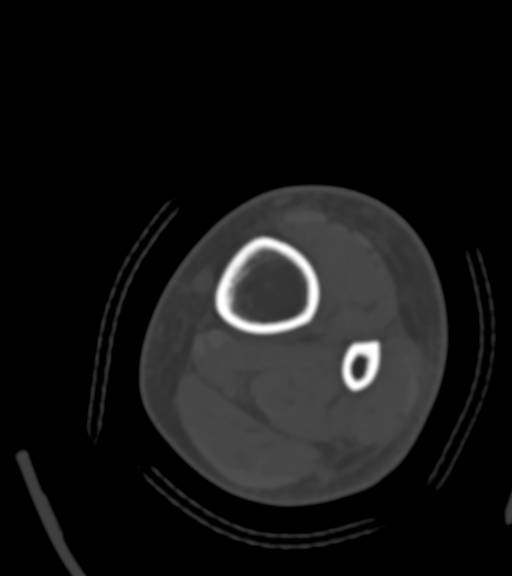

[Series 9: sfov lower extremity 2.00 br40 s3 soft · coronal · 0.26mm/px · 3 of 75 slices shown (2 of 3)]
[im 15/75  bone]
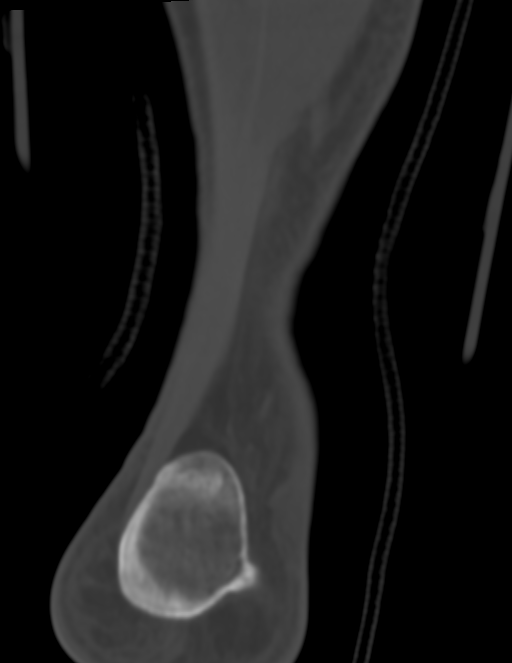
[im 30/75  bone]
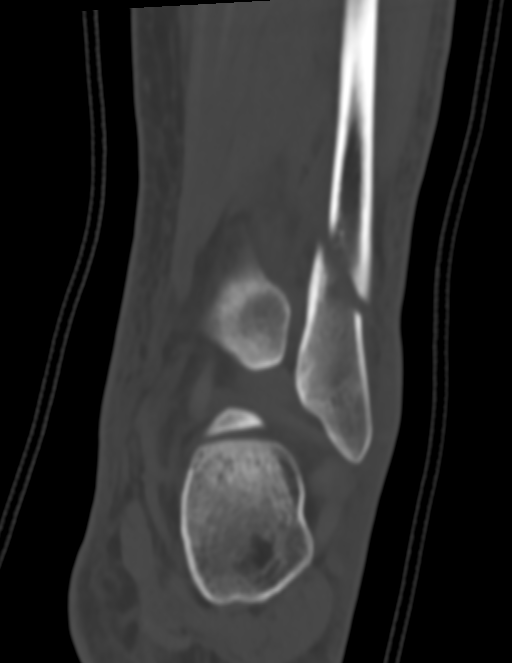
[im 45/75  bone]
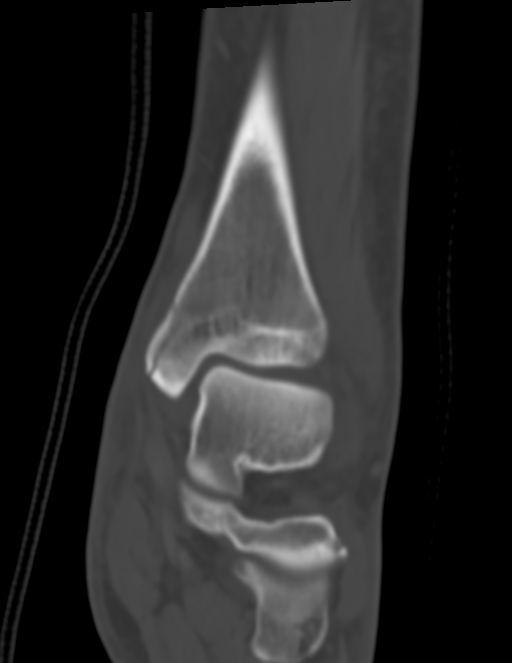

[Series 13: sfov lower extremity 2.00 br40 s3 soft · sagittal · 0.30mm/px · 5 of 67 slices shown, 6 images (3 of 3)]
[im 23/67  bone]
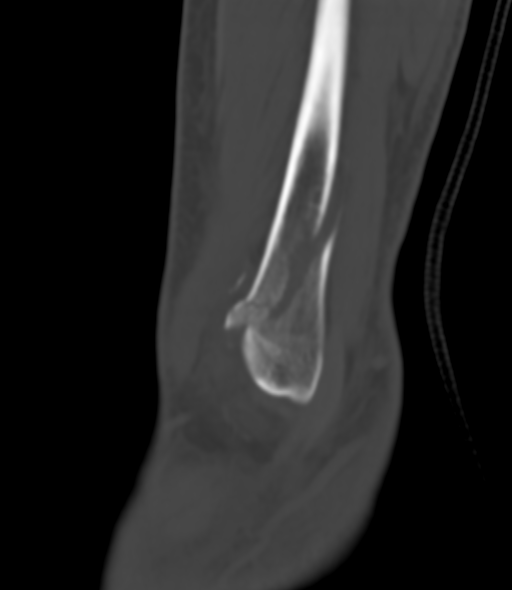
[im 28/67  bone]
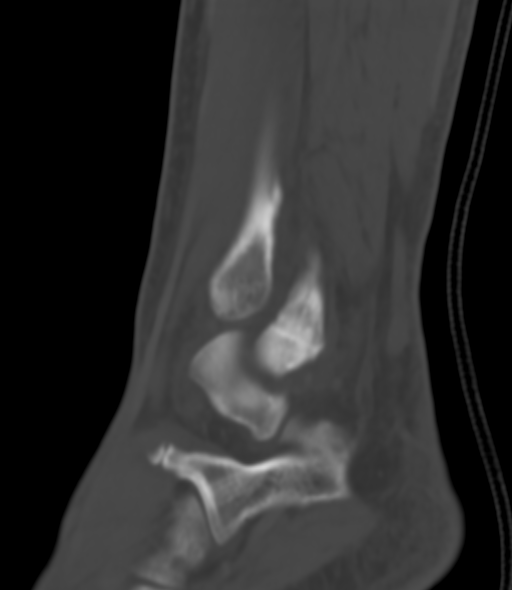
[im 34/67  soft-tissue]
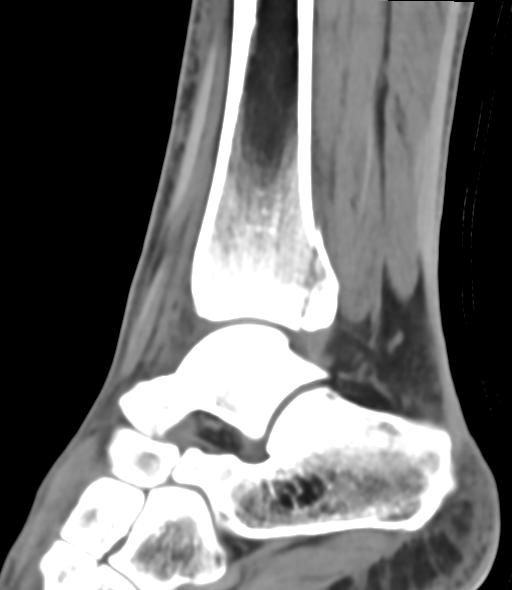
[im 34/67  bone]
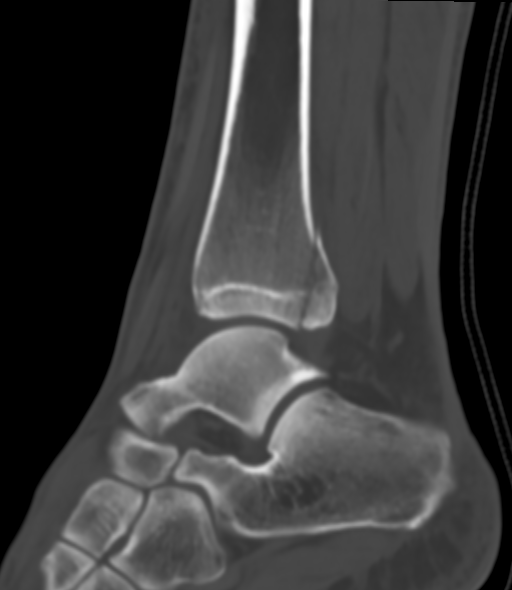
[im 39/67  bone]
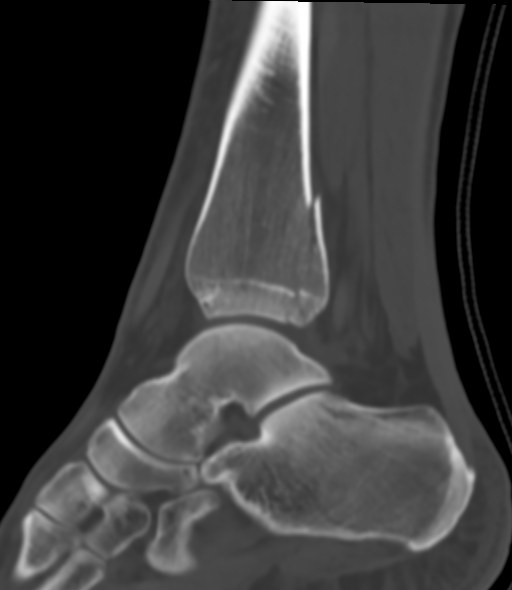
[im 45/67  bone]
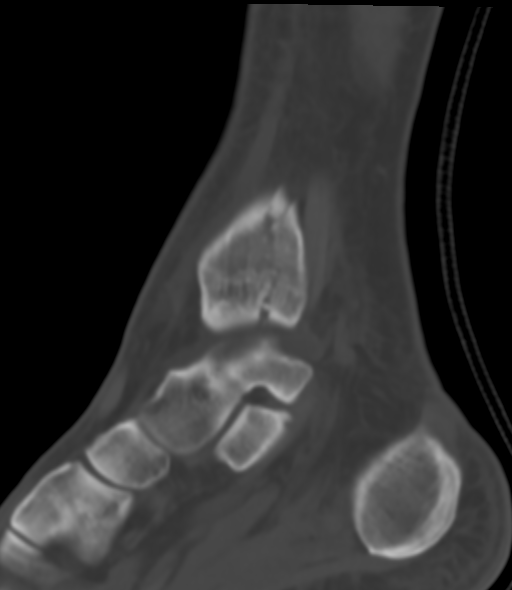

[13 of 33 positions shown; findings below may reference images not displayed]

FINDINGS: Bones/Joint/Cartilage

There is a trimalleolar ankle fracture. Oblique distal fibular
fracture demonstrates up to 6 mm posteromedial displacement. There
is a complex intra-articular distal tibial fracture extending into
the medial malleolus and involving the posterior malleolus.
Approximally 2 mm displacement of the posterior malleolar fragment,
which involves approximately 10% of the articular surface laterally
but up to 35% medially.

Probable small avulsion of the anterior inferior tibiofibular
ligament off of the distal tibia.

There is no significant ankle arthritis. There is no long gated
anterior calcaneal process with accessory ossicle at the tip,
encroaching upon the navicular bone, suggestive of a non-osseous
coalition. There is an os peroneum.

Ligaments

Suboptimally assessed by CT.

Muscles and Tendons

No significant muscle atrophy.  No evidence of tendon entrapment.

Soft tissues

Extensive soft tissue swelling.  No focal fluid collection.
IMPRESSION: Trimalleolar ankle fracture with mildly displaced obliquely oriented
distal fibular fracture and complex intra-articular fracture of the
distal tibia involving the posterior malleolus and extending into
the medial malleolus. The posterior malleolar fracture involves up
to 35% of the articular surface medially and 10% laterally.

Probable small anterior inferior tibiofibular ligament avulsion
fracture off of the distal tibia.

Elongated anterior calcaneal process with adjacent accessory
ossicle, encroaching upon the navicular, suggestive of a non-osseous
coalition.
